# Patient Record
Sex: Male | Born: 1970 | Race: Black or African American | Hispanic: No | Smoking: Current every day smoker
Health system: Southern US, Community
[De-identification: ages and names within clinical notes are randomized; demographics above are authoritative.]

## PROBLEM LIST (undated history)

## (undated) DIAGNOSIS — D573 Sickle-cell trait: Secondary | ICD-10-CM

## (undated) DIAGNOSIS — R011 Cardiac murmur, unspecified: Secondary | ICD-10-CM

---

## 2000-12-12 ENCOUNTER — Encounter: Payer: Self-pay | Admitting: Emergency Medicine

## 2000-12-12 ENCOUNTER — Emergency Department (HOSPITAL_COMMUNITY): Admission: EM | Admit: 2000-12-12 | Discharge: 2000-12-12 | Payer: Self-pay | Admitting: Emergency Medicine

## 2001-02-20 ENCOUNTER — Encounter: Payer: Self-pay | Admitting: Emergency Medicine

## 2001-02-20 ENCOUNTER — Emergency Department (HOSPITAL_COMMUNITY): Admission: EM | Admit: 2001-02-20 | Discharge: 2001-02-20 | Payer: Self-pay | Admitting: Emergency Medicine

## 2001-02-21 ENCOUNTER — Encounter: Payer: Self-pay | Admitting: Emergency Medicine

## 2001-02-21 ENCOUNTER — Emergency Department (HOSPITAL_COMMUNITY): Admission: EM | Admit: 2001-02-21 | Discharge: 2001-02-21 | Payer: Self-pay | Admitting: Emergency Medicine

## 2003-09-07 ENCOUNTER — Encounter: Payer: Self-pay | Admitting: Emergency Medicine

## 2003-09-07 ENCOUNTER — Emergency Department (HOSPITAL_COMMUNITY): Admission: EM | Admit: 2003-09-07 | Discharge: 2003-09-07 | Payer: Self-pay | Admitting: Emergency Medicine

## 2006-10-08 ENCOUNTER — Emergency Department (HOSPITAL_COMMUNITY): Admission: EM | Admit: 2006-10-08 | Discharge: 2006-10-08 | Payer: Self-pay | Admitting: Emergency Medicine

## 2006-10-18 ENCOUNTER — Emergency Department (HOSPITAL_COMMUNITY): Admission: EM | Admit: 2006-10-18 | Discharge: 2006-10-18 | Payer: Self-pay | Admitting: Family Medicine

## 2008-03-07 ENCOUNTER — Emergency Department (HOSPITAL_COMMUNITY): Admission: EM | Admit: 2008-03-07 | Discharge: 2008-03-07 | Payer: Self-pay | Admitting: Emergency Medicine

## 2009-03-31 ENCOUNTER — Emergency Department (HOSPITAL_COMMUNITY): Admission: EM | Admit: 2009-03-31 | Discharge: 2009-03-31 | Payer: Self-pay | Admitting: Emergency Medicine

## 2009-12-15 ENCOUNTER — Emergency Department (HOSPITAL_COMMUNITY): Admission: EM | Admit: 2009-12-15 | Discharge: 2009-12-15 | Payer: Self-pay | Admitting: Emergency Medicine

## 2011-04-07 LAB — URINALYSIS, ROUTINE W REFLEX MICROSCOPIC
Bilirubin Urine: NEGATIVE
Ketones, ur: NEGATIVE mg/dL
Nitrite: NEGATIVE
pH: 6 (ref 5.0–8.0)

## 2011-04-07 LAB — URINE MICROSCOPIC-ADD ON

## 2013-06-12 ENCOUNTER — Encounter (HOSPITAL_COMMUNITY): Payer: Self-pay | Admitting: Nurse Practitioner

## 2013-06-12 ENCOUNTER — Emergency Department (HOSPITAL_COMMUNITY): Payer: Self-pay

## 2013-06-12 ENCOUNTER — Emergency Department (HOSPITAL_COMMUNITY)
Admission: EM | Admit: 2013-06-12 | Discharge: 2013-06-12 | Disposition: A | Discharge status: Home / Self Care | Payer: Self-pay | Attending: Emergency Medicine | Admitting: Emergency Medicine

## 2013-06-12 DIAGNOSIS — R011 Cardiac murmur, unspecified: Secondary | ICD-10-CM | POA: Insufficient documentation

## 2013-06-12 DIAGNOSIS — M25519 Pain in unspecified shoulder: Secondary | ICD-10-CM | POA: Insufficient documentation

## 2013-06-12 DIAGNOSIS — M25819 Other specified joint disorders, unspecified shoulder: Secondary | ICD-10-CM | POA: Insufficient documentation

## 2013-06-12 DIAGNOSIS — F17200 Nicotine dependence, unspecified, uncomplicated: Secondary | ICD-10-CM | POA: Insufficient documentation

## 2013-06-12 DIAGNOSIS — M7542 Impingement syndrome of left shoulder: Secondary | ICD-10-CM

## 2013-06-12 DIAGNOSIS — Z862 Personal history of diseases of the blood and blood-forming organs and certain disorders involving the immune mechanism: Secondary | ICD-10-CM | POA: Insufficient documentation

## 2013-06-12 DIAGNOSIS — R093 Abnormal sputum: Secondary | ICD-10-CM | POA: Insufficient documentation

## 2013-06-12 DIAGNOSIS — J069 Acute upper respiratory infection, unspecified: Secondary | ICD-10-CM | POA: Insufficient documentation

## 2013-06-12 HISTORY — DX: Sickle-cell trait: D57.3

## 2013-06-12 HISTORY — DX: Cardiac murmur, unspecified: R01.1

## 2013-06-12 MED ORDER — IBUPROFEN 400 MG PO TABS
400.0000 mg | ORAL_TABLET | Freq: Once | ORAL | Status: AC
  Administered 2013-06-12: 400 mg via ORAL
  Filled 2013-06-12: qty 1

## 2013-06-12 MED ORDER — OXYCODONE-ACETAMINOPHEN 5-325 MG PO TABS
1.0000 | ORAL_TABLET | Freq: Once | ORAL | Status: AC
  Administered 2013-06-12: 1 via ORAL
  Filled 2013-06-12: qty 1

## 2013-06-12 MED ORDER — OXYCODONE-ACETAMINOPHEN 5-325 MG PO TABS: ORAL_TABLET | ORAL | Status: DC

## 2013-06-12 NOTE — ED Notes (Signed)
Pt states he has had left mid-clavicular pain, sharp, going all the way through to his upper shoulder x 3 months. Pain worse with movement and position. Also c/o cough x 2 days. Started with "a tickle" in his throat. States his father has been coughing violently in the house without covering his mouth. Denies weight loss. Pt is in no distress at this time.

## 2013-06-12 NOTE — ED Notes (Signed)
Pt reports L shoulder pain for past 3 months, denies any injuries, cms intact. Also states "i think i might be getting pneumonia" reports cough with green sputum. Denies any cp, sob, or fevers. A&Ox4, resp e/u

## 2013-06-12 NOTE — ED Provider Notes (Signed)
History     CSN: [id number]  Arrival date & time 06/12/13  0930   First MD Initiated Contact with Patient 06/12/13 [phone]      Chief Complaint  Patient presents with  . Cough    (Consider location/radiation/quality/duration/timing/severity/associated sxs/prior treatment) HPI  Justin Rubio is a 42 y.o. male complaining of left shoulder pain worsening over the course of 3 months. Patient denies any trauma however he does state that he exercises frequently. Patient also reports a cough productive of green sputum worsening over the course of the last 5 days. He denies chest pain, fever, shortness of breath, abdominal pain, nausea vomiting, change in bowel or bladder habits.  Past Medical History  Diagnosis Date  . Sickle cell trait   . Heart murmur     History reviewed. No pertinent past surgical history.  History reviewed. No pertinent family history.  History  Substance Use Topics  . Smoking status: Current Every Day Smoker    Types: Cigarettes  . Smokeless tobacco: Not on file  . Alcohol Use: Yes      Review of Systems  Constitutional: Negative for fever.  Respiratory: Positive for cough. Negative for shortness of breath.   Cardiovascular: Negative for chest pain.  Gastrointestinal: Negative for nausea, vomiting, abdominal pain and diarrhea.  Musculoskeletal: Positive for arthralgias.  All other systems reviewed and are negative.    Allergies  Review of patient's allergies indicates no known allergies.  Home Medications   Current Outpatient Rx  Name  Route  Sig  Dispense  Refill  . oxyCODONE-acetaminophen (PERCOCET/ROXICET) 5-325 MG per tablet      1 to 2 tabs PO q6hrs  PRN for pain   15 tablet   0     BP 128/73  Pulse 88  Temp(Src) 98.7 F (37.1 C)  Resp 19  Ht 5\' 10"  (1.778 m)  Wt 160 lb (72.576 kg)  BMI 22.96 kg/m2  SpO2 98%  Physical Exam  Nursing note and vitals reviewed. Constitutional: He is oriented to person, place, and time. He  appears well-developed and well-nourished. No distress.  HENT:  Head: Normocephalic.  Mouth/Throat: Oropharynx is clear and moist.  Eyes: Conjunctivae and EOM are normal. Pupils are equal, round, and reactive to light.  Cardiovascular: Normal rate, regular rhythm and intact distal pulses.   Pulmonary/Chest: Effort normal and breath sounds normal. No stridor. No respiratory distress. He has no wheezes. He has no rales. He exhibits no tenderness.  Abdominal: Soft. Bowel sounds are normal. He exhibits no distension and no mass. There is no tenderness. There is no rebound and no guarding.  Musculoskeletal: Normal range of motion.  Palpable click in left shoulder abduction over 90. Diffusely tender to palpation of rotator cuff musculature. Neurovascularly intact.  Neurological: He is alert and oriented to person, place, and time.  Psychiatric: He has a normal mood and affect.    ED Course  Procedures (including critical care time)  Labs Reviewed - No data to display Dg Chest 2 View  06/12/2013   *RADIOLOGY REPORT*  Clinical Data: Cough and shortness of breath  CHEST - 2 VIEW  Comparison: None.  Findings: Lungs clear.  Heart size and pulmonary vascularity are normal.  No adenopathy.  No bone lesions.  IMPRESSION: No abnormality noted.   Original Report Authenticated By: Bretta Bang, M.D.   Dg Shoulder Left  06/12/2013   *RADIOLOGY REPORT*  Clinical Data: Shoulder pain  LEFT SHOULDER - 2+ VIEW  Comparison: None.  Findings:  Internal  rotation, external rotation, axillary, and Y scapular images were obtained.  There is no fracture or dislocation.  The left clavicle is positioned slightly superior to the acromion, raising concern for mild acromioclavicular separation of this area.  There is no appreciable joint space narrowing.  No erosive change or intra-articular calcifications.  IMPRESSION:   Question mild acromioclavicular separation.  Focal examination with respect to potential  acromioclavicular separation advised.  No frank dislocation.  No fracture.  No appreciable arthropathy.   Original Report Authenticated By: Bretta Bang, M.D.     1. Acute URI   2. Shoulder impingement syndrome, left       MDM   Filed Vitals:   06/12/13 0940  BP: 128/73  Pulse: 88  Temp: 98.7 F (37.1 C)  Resp: 19  Height: 5\' 10"  (1.778 m)  Weight: 160 lb (72.576 kg)  SpO2: 98%     Justin Rubio is a 42 y.o. male with left shoulder pain worsening over 3 months, there is a palpable click when patient abducts the shoulder. Patient reports productive cough, lung sounds are clear and plain films show no infiltrate.  Medications  oxyCODONE-acetaminophen (PERCOCET/ROXICET) 5-325 MG per tablet 1 tablet (not administered)  ibuprofen (ADVIL,MOTRIN) tablet 400 mg (400 mg Oral Given 06/12/13 1100)    Pt is hemodynamically stable, appropriate for, and amenable to discharge at this time. Pt verbalized understanding and agrees with care plan. Outpatient follow-up and specific return precautions discussed.    New Prescriptions   OXYCODONE-ACETAMINOPHEN (PERCOCET/ROXICET) 5-325 MG PER TABLET    1 to 2 tabs PO q6hrs  PRN for pain          Wynetta Emery, PA-C 06/12/13 1555

## 2013-06-13 NOTE — ED Provider Notes (Signed)
Medical screening examination/treatment/procedure(s) were performed by non-physician practitioner and as supervising physician I was immediately available for consultation/collaboration.   Yerlin Gasparyan M Emrys Mckamie, DO 06/13/13 0947 

## 2014-06-20 ENCOUNTER — Emergency Department (HOSPITAL_COMMUNITY): Payer: No Typology Code available for payment source

## 2014-06-20 ENCOUNTER — Emergency Department (HOSPITAL_COMMUNITY)
Admission: EM | Admit: 2014-06-20 | Discharge: 2014-06-20 | Disposition: A | Discharge status: Home / Self Care | Payer: No Typology Code available for payment source | Attending: Emergency Medicine | Admitting: Emergency Medicine

## 2014-06-20 ENCOUNTER — Encounter (HOSPITAL_COMMUNITY): Payer: Self-pay | Admitting: Emergency Medicine

## 2014-06-20 DIAGNOSIS — R52 Pain, unspecified: Secondary | ICD-10-CM | POA: Insufficient documentation

## 2014-06-20 DIAGNOSIS — Z23 Encounter for immunization: Secondary | ICD-10-CM | POA: Insufficient documentation

## 2014-06-20 DIAGNOSIS — T07XXXA Unspecified multiple injuries, initial encounter: Secondary | ICD-10-CM

## 2014-06-20 DIAGNOSIS — IMO0002 Reserved for concepts with insufficient information to code with codable children: Secondary | ICD-10-CM | POA: Insufficient documentation

## 2014-06-20 DIAGNOSIS — Z862 Personal history of diseases of the blood and blood-forming organs and certain disorders involving the immune mechanism: Secondary | ICD-10-CM | POA: Insufficient documentation

## 2014-06-20 DIAGNOSIS — Y929 Unspecified place or not applicable: Secondary | ICD-10-CM | POA: Insufficient documentation

## 2014-06-20 DIAGNOSIS — Z791 Long term (current) use of non-steroidal anti-inflammatories (NSAID): Secondary | ICD-10-CM | POA: Insufficient documentation

## 2014-06-20 DIAGNOSIS — Z79899 Other long term (current) drug therapy: Secondary | ICD-10-CM | POA: Insufficient documentation

## 2014-06-20 DIAGNOSIS — Y9389 Activity, other specified: Secondary | ICD-10-CM | POA: Insufficient documentation

## 2014-06-20 DIAGNOSIS — F17200 Nicotine dependence, unspecified, uncomplicated: Secondary | ICD-10-CM | POA: Insufficient documentation

## 2014-06-20 DIAGNOSIS — R011 Cardiac murmur, unspecified: Secondary | ICD-10-CM | POA: Insufficient documentation

## 2014-06-20 DIAGNOSIS — M25511 Pain in right shoulder: Secondary | ICD-10-CM

## 2014-06-20 MED ORDER — OXYCODONE-ACETAMINOPHEN 5-325 MG PO TABS: 2.0000 | ORAL_TABLET | ORAL | Status: DC | PRN

## 2014-06-20 MED ORDER — IBUPROFEN 800 MG PO TABS: 800.0000 mg | ORAL_TABLET | Freq: Three times a day (TID) | ORAL | Status: DC

## 2014-06-20 MED ORDER — METHOCARBAMOL 750 MG PO TABS: 750.0000 mg | ORAL_TABLET | Freq: Four times a day (QID) | ORAL | Status: DC

## 2014-06-20 MED ORDER — IBUPROFEN 800 MG PO TABS
800.0000 mg | ORAL_TABLET | Freq: Once | ORAL | Status: AC
  Administered 2014-06-20: 800 mg via ORAL
  Filled 2014-06-20: qty 1

## 2014-06-20 MED ORDER — TETANUS-DIPHTH-ACELL PERTUSSIS 5-2.5-18.5 LF-MCG/0.5 IM SUSP
0.5000 mL | Freq: Once | INTRAMUSCULAR | Status: AC
  Administered 2014-06-20: 0.5 mL via INTRAMUSCULAR
  Filled 2014-06-20: qty 0.5

## 2014-06-20 MED ORDER — OXYCODONE-ACETAMINOPHEN 5-325 MG PO TABS
2.0000 | ORAL_TABLET | Freq: Once | ORAL | Status: AC
  Administered 2014-06-20: 2 via ORAL
  Filled 2014-06-20: qty 2
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
## 2014-07-03 ENCOUNTER — Emergency Department (HOSPITAL_COMMUNITY)
Admission: EM | Admit: 2014-07-03 | Discharge: 2014-07-03 | Disposition: A | Discharge status: Home / Self Care | Payer: No Typology Code available for payment source | Attending: Emergency Medicine | Admitting: Emergency Medicine

## 2014-07-03 ENCOUNTER — Encounter (HOSPITAL_COMMUNITY): Payer: Self-pay | Admitting: Emergency Medicine

## 2014-07-03 DIAGNOSIS — R209 Unspecified disturbances of skin sensation: Secondary | ICD-10-CM | POA: Insufficient documentation

## 2014-07-03 DIAGNOSIS — Z79899 Other long term (current) drug therapy: Secondary | ICD-10-CM | POA: Insufficient documentation

## 2014-07-03 DIAGNOSIS — Z87828 Personal history of other (healed) physical injury and trauma: Secondary | ICD-10-CM | POA: Insufficient documentation

## 2014-07-03 DIAGNOSIS — Z862 Personal history of diseases of the blood and blood-forming organs and certain disorders involving the immune mechanism: Secondary | ICD-10-CM | POA: Insufficient documentation

## 2014-07-03 DIAGNOSIS — H539 Unspecified visual disturbance: Secondary | ICD-10-CM | POA: Insufficient documentation

## 2014-07-03 DIAGNOSIS — F17200 Nicotine dependence, unspecified, uncomplicated: Secondary | ICD-10-CM | POA: Insufficient documentation

## 2014-07-03 DIAGNOSIS — R202 Paresthesia of skin: Secondary | ICD-10-CM

## 2014-07-03 DIAGNOSIS — R011 Cardiac murmur, unspecified: Secondary | ICD-10-CM | POA: Insufficient documentation

## 2014-07-03 MED ORDER — ACETAMINOPHEN 325 MG PO TABS
650.0000 mg | ORAL_TABLET | Freq: Once | ORAL | Status: AC
  Administered 2014-07-03: 650 mg via ORAL
  Filled 2014-07-03: qty 2
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
## 2014-07-03 NOTE — ED Provider Notes (Signed)
Medical screening examination/treatment/procedure(s) were performed by non-physician practitioner and as supervising physician I was immediately available for consultation/collaboration.   EKG Interpretation None        Kirstine Jacquin M Emogene Muratalla, MD 07/03/14 1549 
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
## 2014-07-07 ENCOUNTER — Encounter (HOSPITAL_COMMUNITY): Payer: Self-pay | Admitting: Emergency Medicine

## 2014-07-07 ENCOUNTER — Emergency Department (HOSPITAL_COMMUNITY)
Admission: EM | Admit: 2014-07-07 | Discharge: 2014-07-07 | Disposition: A | Discharge status: Home / Self Care | Payer: No Typology Code available for payment source | Attending: Emergency Medicine | Admitting: Emergency Medicine

## 2014-07-07 DIAGNOSIS — Z79899 Other long term (current) drug therapy: Secondary | ICD-10-CM | POA: Insufficient documentation

## 2014-07-07 DIAGNOSIS — Z043 Encounter for examination and observation following other accident: Secondary | ICD-10-CM | POA: Insufficient documentation

## 2014-07-07 DIAGNOSIS — Z862 Personal history of diseases of the blood and blood-forming organs and certain disorders involving the immune mechanism: Secondary | ICD-10-CM | POA: Insufficient documentation

## 2014-07-07 DIAGNOSIS — R29898 Other symptoms and signs involving the musculoskeletal system: Secondary | ICD-10-CM | POA: Insufficient documentation

## 2014-07-07 DIAGNOSIS — R011 Cardiac murmur, unspecified: Secondary | ICD-10-CM | POA: Insufficient documentation

## 2014-07-07 DIAGNOSIS — Z041 Encounter for examination and observation following transport accident: Secondary | ICD-10-CM

## 2014-07-07 DIAGNOSIS — F17200 Nicotine dependence, unspecified, uncomplicated: Secondary | ICD-10-CM | POA: Insufficient documentation

## 2014-07-07 MED ORDER — IBUPROFEN 800 MG PO TABS
800.0000 mg | ORAL_TABLET | Freq: Once | ORAL | Status: AC
  Administered 2014-07-07: 800 mg via ORAL
  Filled 2014-07-07: qty 1

## 2014-07-07 MED ORDER — CYCLOBENZAPRINE HCL 10 MG PO TABS
5.0000 mg | ORAL_TABLET | Freq: Once | ORAL | Status: AC
  Administered 2014-07-07: 5 mg via ORAL
  Filled 2014-07-07: qty 1

## 2014-07-07 NOTE — ED Notes (Signed)
Here previous for same symptoms
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
## 2014-07-07 NOTE — Discharge Instructions (Signed)
°Emergency Department Resource Guide °1) Find a Doctor and Pay Out of Pocket °Although you won't have to find out who is covered by your insurance plan, it is a good idea to ask around and get recommendations. You will then need to call the office and see if the doctor you have chosen will accept you as a new patient and what types of options they offer for patients who are self-pay. Some doctors offer discounts or will set up payment plans for their patients who do not have insurance, but you will need to ask so you aren't surprised when you get to your appointment. ° °2) Contact Your Local Health Department °Not all health departments have doctors that can see patients for sick visits, but many do, so it is worth a call to see if yours does. If you don't know where your local health department is, you can check in your phone book. The CDC also has a tool to help you locate your state's health department, and many state websites also have listings of all of their local health departments. ° °3) Find a Walk-in Clinic °If your illness is not likely to be very severe or complicated, you may want to try a walk in clinic. These are popping up all over the country in pharmacies, drugstores, and shopping centers. They're usually staffed by nurse practitioners or physician assistants that have been trained to treat common illnesses and complaints. They're usually fairly quick and inexpensive. However, if you have serious medical issues or chronic medical problems, these are probably not your best option. ° °No Primary Care Doctor: °- Call [facility] at  832-8000 - they can help you locate a primary care doctor that  accepts your insurance, provides certain services, etc. °- Physician Referral Service- 1-800-533-3463 ° °Chronic Pain Problems: °Organization         Address  Phone   Notes  °[facility]  (336) 297-2271 Patients need to be referred by their primary care doctor.  ° °Medication  Assistance: °Organization         Address  Phone   Notes  °[place] [facility] [address] °[place], [place] [address] (336) 641-8030 --Must be a resident of [place] °-- Must have NO insurance coverage whatsoever (no Medicaid/ Medicare, etc.) °-- The pt. MUST have a primary care doctor that directs their care regularly and follows them in the community °  °[facility]  (866) 331-1348   °[employer]  (888) 892-1162   ° °Agencies that provide inexpensive medical care: °Organization         Address  Phone   Notes  °[facility]  (336) 832-8035   ° Internal Medicine    (336) 832-7272   °[facility] [address] °[place], [place] [address] (336) 832-4777   °[facility] [address], °[place] (336) 271-4999   °[facility]    (336) 373-0678   °[facility]    (336) 272-1050   °[facility] ° [address], [place] Phone:  (336) 832-4444, Fax:  (336) 832-4440 Hours of Operation:  9 am - 6 pm, M-F.  Also accepts Medicaid/Medicare and self-pay.  °[facility] ° [address], [place] Phone: (336) 832-3150, Fax: (336) 832-3151. Hours of Operation:  8:30 am - 5:30 pm, M-F.  Also accepts Medicaid and self-pay.  °[facility] [place] [address], [place] Phone: (336) 878-6027   °[facility] [address], [place], [place] (336)723-1848, Ext. 123 Mondays & Thursdays: 7-9 AM.  First 15 patients are seen on a first come, first serve basis. °  ° °Medicaid-accepting [place] Providers: ° °Organization         Address  Phone   Notes  °[facility] [address], [place] (336) 641-2100 Also accepts self-pay patients.  °[facility] [address], [place] ° (336) 856-9996   °[facility] [address], [place]  (336) 288-8857   °[facility] [address] [place] [address], [place] (336) 299-7000   °Veita Bland [address], [place]  ° (336) 373-1557 Only accepts [place] Access Medicaid patients after they have their name applied to their card.  ° °Self-Pay (no insurance) in [place]: ° °Organization         Address  Phone   Notes  °Sickle Cell Patients, [facility] [address], [place] (336) 832-1970   °[facility] [address], [place] (336) 832-4400   °[facility] ° [address],  (336) 992-4800   °[facility]/Dr. Osei-Bonsu ° [address] [place] [address], [place] or [address], [place] (336) 841-8500 Phone number for both [place] and [place] locations is the same.  °[facility] [address], [place] (336) 299-0000   °[facility] [address] [place] [address], [place] or [address] (336) 852-7530 °(336) 878-2260   °[facility] [address], [place] (336) 350-1642, phone; (336) 294-5005, fax Sees patients 1st and 3rd Saturday of every month.  Must not qualify for public or private insurance (i.e. Medicaid, Medicare, [place] Health Choice, Veterans' Benefits) • Household income should be no more than 200% of the poverty level •The clinic cannot treat you if you are pregnant or think you are pregnant • Sexually transmitted diseases are not treated at the clinic.  ° ° °Dental Care: °Organization         Address  Phone  Notes  °[place] [facility] [address], [place] (336) 641-6152 Accepts children up to age 21 who are enrolled in Medicaid or [place] Health Choice; pregnant women with a Medicaid card; and children who have applied for Medicaid or [place] Health Choice, but were declined, whose parents can pay a reduced fee at time of service.  °[place]  [facility] [place]  [address], [place] (336) 641-7733 Accepts children up to age 21 who are enrolled in Medicaid or [place] Health Choice; pregnant women with a Medicaid card; and children who have applied for Medicaid or  Health Choice, but were declined, whose parents can pay a reduced fee at time of service.  °[facility] ° [address],  (336) 641-4533 Patients are seen by appointment only. Walk-ins are not accepted. [facility] will see patients 18 years of age and older. °Monday - Tuesday (8am-5pm) °Most Wednesdays (8:30-5pm) °$30 per visit, cash only  °[facility] ° [address], [place] (336) 641-4533 Patients are seen by appointment only. Walk-ins are not accepted. [facility] will see patients 18 years of age and older. °One   Wednesday Evening (Monthly: Volunteer Based).  $30 per visit, cash only  °[facility]  (919) 537-3737 for adults; Children under age 4, call Graduate Pediatric Dentistry at (919) [phone]. Children aged 4-14, please call (919) [phone] to request a pediatric application. ° Dental services are provided in all areas of dental care including fillings, crowns and bridges, complete and partial dentures, implants, gum treatment, root canals, and extractions. Preventive care is also provided. Treatment is provided to both adults and children. °Patients are selected via a lottery and there is often a waiting list. °  °[facility] [address], °[place] ° (336) 763-8833 [email or web address] °  °[facility] [address], [place], [place] (336)723-1848, Ext. 123 Second and Fourth Thursday of each month, opens at 6:30 AM; Clinic ends at 9 AM.  Patients are seen on a first-come first-served basis, and a limited number are seen during each clinic.  ° °[facility] ° [address], [place], [place] (336) 723-7904    Eligibility Requirements °You must have lived in [place], or [place] counties for at least the last three months. °  You cannot be eligible for state or federal sponsored healthcare insurance, including Veterans Administration, Medicaid, or Medicare. °  You generally cannot be eligible for healthcare insurance through your employer.  °  How to apply: °Eligibility screenings are held every Tuesday and Wednesday afternoon from 1:00 pm until 4:00 pm. You do not need an appointment for the interview!  °[facility] [address], [place], [place] 336-631-2330   °[facility]  336-342-8273   °[facility]  336-703-3100   °[facility]  336-570-6415   ° °Behavioral Health Resources in the Community: °Intensive Outpatient Programs °Organization         Address  Phone  Notes  °[facility] [address], [place], [place] 336-878-6098   °[facility] [address], [place], [place] 336-832-9800   °[facility] [address], [place], [place] ° 336-882-2125   °[facility] [address],  °[place], [place] 1-800-853-5163 or 336-641-4981   °Substance Abuse Resources °Organization         Address  Phone  Notes  °[facility]  336-882-2125   °[facility]  336-784-9470   °[facility]  336-285-9073   °[facility]  336-845-3988   °Residential & Outpatient Substance Abuse Program  1-800-659-3381   °Psychological Services °Organization         Address  Phone  Notes  °[facility]  336- 832-9600   °[facility]  336- 378-7881   °[facility] [address], [place] 1-800-853-5163 or 336-641-4981   ° °Mobile Crisis Teams °Organization         Address  Phone  Notes  °[facility], Mobile Crisis Care Unit  1-877-626-1772   °Assertive °[facility] ° [address]  [place], [place] 336-834-9664   °Sharon DeEsch [address] °[place] 336-554-5454   ° °Self-Help/Support Groups °Organization         Address  Phone             Notes  °[facility]  - variety of support groups  336- 373-1402 Call for more information  °Narcotics Anonymous (NA), [facility] [address], °[place]  2 meetings at this location  ° °  Residential Treatment Programs °Organization         Address  Phone  Notes  °[facility] [address],    °[place]  1-866-801-8205   °[facility] ° [address], [place], [place] 704-293-8524   °[facility] [address], [place] 336-845-3988 Admissions: 8am-3pm M-F  °[facility] [address]    °[place], [place] 336-841-1104   °[facility] [address], [place], [place] 336-379-7146   °[facility] [address]  °[place], [place] 336-285-9073   °[facility] - Intensive Outpatient [address], [place], [place] 336-852-3033   °[facility] [address]  °[place], [place] 1-877-615-2722 or 336-784-9470   °[facility] ([facility] [address] [place], [place] 336-227-7417 Accepts Medicaid  °[facility] [address]  °[place] 1-800-659-3381 Substance Abuse/Addiction Treatment  ° °[place] Behavioral Health Resources °Organization         Address  Phone  Notes  °[facility]  (888) 581-9988   °Julie Brannon, PhD [address] [place], [place]   (336) 349-5553 or (336) 951-0000   °[facility]   [address] °[place], [place] (336) 349-4454   °[facility] [address], [place], [place] (336) 342-8316 Insurance/Medicaid/sponsorship through [facility]  °[facility] [address] [place] [address]                                    [place], [place] (336) 342-8316 Therapy/tele-psych/case    °[facility] [address]  ° [place], [place] (336) 349-2233    °Dr. Arfeen  (336) 349-4544   °[facility] [place]  [employer] [place] [facility] 1) [address], [place] °2) [address], [place] °3)  [address], [place] (336) 349-3220 °(336) 342-7768 ° °(336) 342-8140   °[place] [facility] (336) 342-1394 or (336) 342-3537 (After Hours)    ° ° °
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
## 2018-05-21 ENCOUNTER — Emergency Department (HOSPITAL_COMMUNITY)
Admission: EM | Admit: 2018-05-21 | Discharge: 2018-05-21 | Disposition: A | Discharge status: Home / Self Care | Payer: Self-pay | Attending: Emergency Medicine | Admitting: Emergency Medicine

## 2018-05-21 ENCOUNTER — Emergency Department (HOSPITAL_COMMUNITY): Payer: Self-pay

## 2018-05-21 ENCOUNTER — Other Ambulatory Visit: Payer: Self-pay

## 2018-05-21 ENCOUNTER — Encounter (HOSPITAL_COMMUNITY): Payer: Self-pay | Admitting: Emergency Medicine

## 2018-05-21 DIAGNOSIS — Y999 Unspecified external cause status: Secondary | ICD-10-CM | POA: Insufficient documentation

## 2018-05-21 DIAGNOSIS — S61240A Puncture wound with foreign body of right index finger without damage to nail, initial encounter: Secondary | ICD-10-CM | POA: Insufficient documentation

## 2018-05-21 DIAGNOSIS — S60450A Superficial foreign body of right index finger, initial encounter: Secondary | ICD-10-CM

## 2018-05-21 DIAGNOSIS — W228XXA Striking against or struck by other objects, initial encounter: Secondary | ICD-10-CM | POA: Insufficient documentation

## 2018-05-21 DIAGNOSIS — Y929 Unspecified place or not applicable: Secondary | ICD-10-CM | POA: Insufficient documentation

## 2018-05-21 DIAGNOSIS — Y93H9 Activity, other involving exterior property and land maintenance, building and construction: Secondary | ICD-10-CM | POA: Insufficient documentation

## 2018-05-21 DIAGNOSIS — F17210 Nicotine dependence, cigarettes, uncomplicated: Secondary | ICD-10-CM | POA: Insufficient documentation

## 2018-05-21 MED ORDER — LIDOCAINE HCL (PF) 1 % IJ SOLN
10.0000 mL | Freq: Once | INTRAMUSCULAR | Status: AC
  Administered 2018-05-21: 10 mL
  Filled 2018-05-21: qty 10

## 2018-05-21 MED ORDER — DOXYCYCLINE HYCLATE 100 MG PO CAPS: 100.0000 mg | ORAL_CAPSULE | Freq: Two times a day (BID) | ORAL | 0 refills | Status: DC

## 2018-05-21 NOTE — Discharge Instructions (Addendum)
Take antibiotics as prescribed.  Take the entire course, even if your symptoms improve. Soak your hand in antibacterial soapy water for 20 minutes at a time 2-3 times a day for the next 10 days. When you are not soaking your hand, keep a dry dressing on your finger, change it daily. Follow-up with the hand doctor if you have any worsening symptoms, or symptoms are not resolved after finishing antibiotics. Return to the emergency room if you develop high fevers, inability to move your finger, streaking red lines going up your hand, or any new or concerning symptoms.

## 2018-05-21 NOTE — ED Provider Notes (Signed)
[facility] EMERGENCY DEPARTMENT Provider Note   CSN: [id number] Arrival date & time: 05/21/18  1945     History   Chief Complaint Chief Complaint  Patient presents with  . Finger Injury    HPI Justin Rubio is a 47 y.o. male presenting for evaluation of right index finger injury.  Patient presenting for evaluation of right index finger injury.  Last Saturday, he was removing a wedge from a tree and the hammer hit the ulnar aspect of his right index finger at the PIP.  He had acute onset of pain and bleeding from the radial aspect of his finger.  Since then, he has had persistent pain and continued swelling.  He reports pus is draining out of the site of injury.  He states it feels like something is in his finger.  He denies numbness or tingling.  He has a history of sickle cell trait and anemia, but does not take any medications daily.  He is not on blood thinners.  He is not immunocompromised.  He denies streaking or redness going into his hand.  Pain with movement of his fingers.  He is unable to completely flex his finger due to pain and swelling.  He reports feeling abnormal sensations of the middle and fourth finger today.  He has been taking ibuprofen without improvement of pain.  Nothing has made it better.  He denies fevers, but states that his hand and forearm has felt off since the injury.   HPI  Past Medical History:  Diagnosis Date  . Heart murmur   . Sickle cell trait (HCC)     There are no active problems to display for this patient.   History reviewed. No pertinent surgical history.      Home Medications    Prior to Admission medications   Medication Sig Start Date End Date Taking? Authorizing Provider  doxycycline (VIBRAMYCIN) 100 MG capsule Take 1 capsule (100 mg total) by mouth 2 (two) times daily. 05/21/18   Lesle Faron, PA-C  methocarbamol (ROBAXIN-750) 750 MG tablet Take 1 tablet (750 mg total) by mouth 4 (four) times  daily. Patient not taking: Reported on 05/21/2018 06/20/14   Marisa Severin, MD  oxyCODONE-acetaminophen (PERCOCET/ROXICET) 5-325 MG per tablet Take 2 tablets by mouth every 4 (four) hours as needed for severe pain. Patient not taking: Reported on 05/21/2018 06/20/14   Marisa Severin, MD    Family History No family history on file.  Social History Social History   Tobacco Use  . Smoking status: Current Every Day Smoker    Types: Cigarettes  . Smokeless tobacco: Never Used  Substance Use Topics  . Alcohol use: Yes  . Drug use: Yes    Types: Cocaine, Marijuana     Allergies   Penicillins   Review of Systems Review of Systems  Constitutional: Negative for fever.  Musculoskeletal: Positive for arthralgias and joint swelling.  Neurological: Negative for numbness.  Hematological: Does not bruise/bleed easily.     Physical Exam Updated Vital Signs BP 110/68   Pulse 78   Temp 98.2 F (36.8 C) (Oral)   Resp 18   SpO2 100%   Physical Exam  Constitutional: He is oriented to person, place, and time. He appears well-developed and well-nourished. No distress.  HENT:  Head: Normocephalic and atraumatic.  Eyes: EOM are normal.  Neck: Normal range of motion.  Pulmonary/Chest: Effort normal.  Abdominal: He exhibits no distension.  Musculoskeletal: He exhibits edema, tenderness and deformity.  Obvious swelling of the PIP of the right index finger with open cut without active drainage or bleeding.  Decreased range of motion due to pain and swelling.  Sensation intact.  Good cap refill.  No obvious streaking.  Full active range of motion of thumb and remaining fingers.  Full active ROM  of the wrist. Soft compartments. Radial pulses intact bilaterally.   Neurological: He is alert and oriented to person, place, and time. No sensory deficit.  Skin: Skin is warm. No rash noted.  Psychiatric: He has a normal mood and affect.  Nursing note and vitals reviewed.        ED Treatments /  Results  Labs (all labs ordered are listed, but only abnormal results are displayed) Labs Reviewed - No data to display  EKG None  Radiology Dg Hand Complete Right  Result Date: 05/21/2018 CLINICAL DATA:  Right index finger pain and swelling. EXAM: RIGHT HAND - COMPLETE 3+ VIEW COMPARISON:  None. FINDINGS: There is no evidence of fracture or dislocation. There is no evidence of arthropathy or other focal bone abnormality. Metallic foreign body in the soft tissues along the dorsal aspect of the second proximal phalanx. No other soft tissue abnormality. IMPRESSION: 1.  No acute osseous injury of the right hand. 2. Metallic foreign body in the soft tissues along the dorsal aspect of the second proximal phalanx. Electronically Signed   By: Elige Ko   On: 05/21/2018 20:37    Procedures .Foreign Body Removal Date/Time: 05/21/2018 11:46 PM Performed by: Alveria Apley, PA-C Authorized by: Alveria Apley, PA-C  Consent: Verbal consent obtained. Risks and benefits: risks, benefits and alternatives were discussed Consent given by: patient Intake: R index finger. Anesthesia: digital block  Anesthesia: Local Anesthetic: lidocaine 1% without epinephrine Anesthetic total: 10 mL Patient cooperative: yes Complexity: simple 1 objects recovered. Objects recovered: metal shard Post-procedure assessment: foreign body removed Patient tolerance: Patient tolerated the procedure well with no immediate complications   (including critical care time)  Medications Ordered in ED Medications  lidocaine (PF) (XYLOCAINE) 1 % injection 10 mL (10 mLs Infiltration Given 05/21/18 2218)     Initial Impression / Assessment and Plan / ED Course  I have reviewed the triage vital signs and the nursing notes.  Pertinent labs & imaging results that were available during my care of the patient were reviewed by me and considered in my medical decision making (see chart for details).     Patient  presenting for evaluation of finger pain.  Physical exam shows swollen DIP joint.  X-ray reviewed and interpreted by me, shows foreign object in the dorsal aspect of the right index finger.  No bony involvement.  Patient without obvious streaking, neurovascularly intact.  Discussed with Dr. Aundria Rud from orthopedics, who recommends I&D/foreign object removal and keeping the incision open.  Recommends twice daily soaks and antibacterial soap and oral antibiotics.  Follow-up with him if symptoms are not improving or worsen.  Foreign object successfully removed.  Patient reports improvement of pain/pressure.  Will discharge with Dr. Aundria Rud' instructions.  Patient wanted to penicillin, placed on doxycycline.  At this time, patient appears a for discharge.  Return percussions given.  Patient states he understands and agrees plan.  Final Clinical Impressions(s) / ED Diagnoses   Final diagnoses:  Foreign body of right index finger    ED Discharge Orders        Ordered    doxycycline (VIBRAMYCIN) 100 MG capsule  2 times daily  05/21/18 2316       Alveria Apley, PA-C 05/21/18 2349    Rolland Porter, MD 05/27/18 [phone]

## 2018-05-21 NOTE — ED Triage Notes (Signed)
C/o infection to R index finger.  States he hit it with a hammer while trying to get a wedge out of a tree last Saturday.  C/o pain, swelling, and difficulty moving finger.

## 2021-01-08 ENCOUNTER — Encounter (HOSPITAL_COMMUNITY): Payer: Self-pay | Admitting: Emergency Medicine

## 2021-01-08 ENCOUNTER — Emergency Department (HOSPITAL_COMMUNITY): Payer: Self-pay

## 2021-01-08 ENCOUNTER — Emergency Department (HOSPITAL_COMMUNITY)
Admission: EM | Admit: 2021-01-08 | Discharge: 2021-01-08 | Disposition: A | Discharge status: Home / Self Care | Payer: Self-pay | Attending: Emergency Medicine | Admitting: Emergency Medicine

## 2021-01-08 DIAGNOSIS — Z5321 Procedure and treatment not carried out due to patient leaving prior to being seen by health care provider: Secondary | ICD-10-CM | POA: Insufficient documentation

## 2021-01-08 DIAGNOSIS — Y990 Civilian activity done for income or pay: Secondary | ICD-10-CM | POA: Insufficient documentation

## 2021-01-08 DIAGNOSIS — Y9289 Other specified places as the place of occurrence of the external cause: Secondary | ICD-10-CM | POA: Insufficient documentation

## 2021-01-08 DIAGNOSIS — W231XXA Caught, crushed, jammed, or pinched between stationary objects, initial encounter: Secondary | ICD-10-CM | POA: Insufficient documentation

## 2021-01-08 DIAGNOSIS — M79662 Pain in left lower leg: Secondary | ICD-10-CM | POA: Insufficient documentation
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
## 2021-01-09 ENCOUNTER — Encounter (HOSPITAL_COMMUNITY): Payer: Self-pay

## 2021-01-09 ENCOUNTER — Ambulatory Visit (HOSPITAL_COMMUNITY)
Admission: EM | Admit: 2021-01-09 | Discharge: 2021-01-09 | Disposition: A | Discharge status: Home / Self Care | Payer: Self-pay

## 2021-01-09 DIAGNOSIS — S8012XA Contusion of left lower leg, initial encounter: Secondary | ICD-10-CM
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
## 2021-01-09 NOTE — Discharge Instructions (Addendum)
Take ibuprofen as needed.  Rest and elevate your leg.  Apply ice packs 2-3 times a day for up to 20 minutes each.   Follow up with an orthopedist if your symptoms are not improving.

## 2021-07-29 ENCOUNTER — Inpatient Hospital Stay (HOSPITAL_COMMUNITY): Admission: RE | Admit: 2021-07-29 | Payer: Self-pay | Source: Ambulatory Visit

## 2021-07-29 ENCOUNTER — Encounter (HOSPITAL_COMMUNITY): Payer: Self-pay

## 2021-07-29 ENCOUNTER — Emergency Department (HOSPITAL_COMMUNITY): Payer: Self-pay

## 2021-07-29 ENCOUNTER — Other Ambulatory Visit: Payer: Self-pay

## 2021-07-29 ENCOUNTER — Emergency Department (HOSPITAL_COMMUNITY)
Admission: EM | Admit: 2021-07-29 | Discharge: 2021-07-29 | Disposition: A | Discharge status: Home / Self Care | Payer: Self-pay | Attending: Emergency Medicine | Admitting: Emergency Medicine

## 2021-07-29 DIAGNOSIS — F17210 Nicotine dependence, cigarettes, uncomplicated: Secondary | ICD-10-CM | POA: Insufficient documentation

## 2021-07-29 DIAGNOSIS — U071 COVID-19: Secondary | ICD-10-CM | POA: Insufficient documentation

## 2021-07-29 DIAGNOSIS — R300 Dysuria: Secondary | ICD-10-CM | POA: Insufficient documentation

## 2021-07-29 LAB — CBC WITH DIFFERENTIAL/PLATELET
Abs Immature Granulocytes: 0.01 10*3/uL (ref 0.00–0.07)
Basophils Absolute: 0.1 10*3/uL (ref 0.0–0.1)
Basophils Relative: 2 %
Eosinophils Absolute: 0.1 10*3/uL (ref 0.0–0.5)
Eosinophils Relative: 2 %
HCT: 39.6 % (ref 39.0–52.0)
Hemoglobin: 14 g/dL (ref 13.0–17.0)
Immature Granulocytes: 0 %
Lymphocytes Relative: 25 %
Lymphs Abs: 1 10*3/uL (ref 0.7–4.0)
MCH: 30.6 pg (ref 26.0–34.0)
MCHC: 35.4 g/dL (ref 30.0–36.0)
MCV: 86.7 fL (ref 80.0–100.0)
Monocytes Absolute: 0.7 10*3/uL (ref 0.1–1.0)
Monocytes Relative: 19 %
Neutro Abs: 2.1 10*3/uL (ref 1.7–7.7)
Neutrophils Relative %: 52 %
Platelets: 269 10*3/uL (ref 150–400)
RBC: 4.57 MIL/uL (ref 4.22–5.81)
RDW: 14.2 % (ref 11.5–15.5)
WBC: 3.9 10*3/uL — ABNORMAL LOW (ref 4.0–10.5)
nRBC: 0 % (ref 0.0–0.2)

## 2021-07-29 LAB — URINALYSIS, ROUTINE W REFLEX MICROSCOPIC
Bilirubin Urine: NEGATIVE
Glucose, UA: NEGATIVE mg/dL
Hgb urine dipstick: NEGATIVE
Ketones, ur: 5 mg/dL — AB
Nitrite: NEGATIVE
Protein, ur: NEGATIVE mg/dL
Specific Gravity, Urine: 1.012 (ref 1.005–1.030)
WBC, UA: >50 WBC/hpf — ABNORMAL HIGH (ref 0–5)
pH: 5 (ref 5.0–8.0)

## 2021-07-29 LAB — COMPREHENSIVE METABOLIC PANEL
ALT: 25 U/L (ref 0–44)
AST: 36 U/L (ref 15–41)
Albumin: 4.1 g/dL (ref 3.5–5.0)
Alkaline Phosphatase: 37 U/L — ABNORMAL LOW (ref 38–126)
Anion gap: 11 (ref 5–15)
BUN: 10 mg/dL (ref 6–20)
CO2: 26 mmol/L (ref 22–32)
Calcium: 8.5 mg/dL — ABNORMAL LOW (ref 8.9–10.3)
Chloride: 101 mmol/L (ref 98–111)
Creatinine, Ser: 1.01 mg/dL (ref 0.61–1.24)
GFR, Estimated: >60 mL/min (ref >60)
Glucose, Bld: 77 mg/dL (ref 70–99)
Potassium: 3.5 mmol/L (ref 3.5–5.1)
Sodium: 138 mmol/L (ref 135–145)
Total Bilirubin: 0.9 mg/dL (ref 0.3–1.2)
Total Protein: 6.8 g/dL (ref 6.5–8.1)

## 2021-07-29 LAB — RESP PANEL BY RT-PCR (FLU A&B, COVID) ARPGX2
Influenza A by PCR: NEGATIVE
Influenza B by PCR: NEGATIVE
SARS Coronavirus 2 by RT PCR: POSITIVE — AB

## 2021-07-29 MED ORDER — ACETAMINOPHEN 325 MG PO TABS
650.0000 mg | ORAL_TABLET | Freq: Once | ORAL | Status: AC
  Administered 2021-07-29: 650 mg via ORAL
  Filled 2021-07-29: qty 2

## 2021-07-29 MED ORDER — AZITHROMYCIN 250 MG PO TABS
2000.0000 mg | ORAL_TABLET | Freq: Every day | ORAL | Status: DC
  Administered 2021-07-29: 2000 mg via ORAL
  Filled 2021-07-29: qty 8

## 2021-07-29 MED ORDER — GENTAMICIN SULFATE 40 MG/ML IJ SOLN
240.0000 mg | Freq: Once | INTRAMUSCULAR | Status: AC
  Administered 2021-07-29: 240 mg via INTRAMUSCULAR
  Filled 2021-07-29: qty 6

## 2021-07-29 MED ORDER — DOXYCYCLINE HYCLATE 100 MG PO CAPS: 100.0000 mg | ORAL_CAPSULE | Freq: Two times a day (BID) | ORAL | 0 refills | Status: AC

## 2021-07-29 MED ORDER — ONDANSETRON 4 MG PO TBDP: 4.0000 mg | ORAL_TABLET | Freq: Three times a day (TID) | ORAL | 0 refills | Status: DC | PRN

## 2021-07-29 NOTE — ED Provider Notes (Signed)
Emergency Medicine Provider Triage Evaluation Note  Justin Rubio , a 50 y.o. male  was evaluated in triage.  Pt complains of generalized weakness, decreased sense of smell, cough, fever, body aches, low back pain, poor p.o. intake.  Patient states he has not been able to eat solid foods for 4 weeks due to the fact that it is hot.  He is tolerating liquids.  Over the past couple days, he started to feel worse.  He denies sick contacts.  He is vaccinated for COVID.  Cough is productive of green/yellow sputum. He reports dizziness, worse with changing head position  Review of Systems  Positive: Cough, arthralgias, weakness, dizziness Negative: HA  Physical Exam  BP 115/84 (BP Location: Right Arm)   Pulse 97   Temp 99.1 F (37.3 C) (Oral)   Resp 18   SpO2 99%  Gen:   Awake, no distress   Resp:  Normal effort  MSK:   Moves extremities without difficulty  Other:  No ttp of the abd  Medical Decision Making  Medically screening exam initiated at 3:54 PM.  Appropriate orders placed.  Justin Rubio was informed that the remainder of the evaluation will be completed by another provider, this initial triage assessment does not replace that evaluation, and the importance of remaining in the ED until their evaluation is complete.  Labs, cxr, ekg, ua, covid   Villa Quintero, PA-C 07/29/21 1556    Gloris Manchester, MD 07/31/21 [phone]

## 2021-07-29 NOTE — ED Notes (Signed)
Called pt 3x, no response.  

## 2021-07-29 NOTE — ED Provider Notes (Signed)
[facility] EM[facility]ENC[facility] DEPARTMENT Provider Note   CSN: [id number] Arrival date & time: 07/29/21  1456     History No chief complaint on file.   Justin Rubio is a 50 y.o. male.  HPI Patient is a 50 year old male with a medical history as noted below.  He presents to the emergency department due to generalized weakness, decreased sense of smell, cough, intermittent fevers, body aches, low back pain, decreased p.o. intake.  States that his symptoms started about 1 week ago.  Also notes that he works in very hot weather outdoors and has had difficulty with p.o. intake due to his work conditions over the past month.  States that his symptoms began worsening over the past 2 days.  Denies any known sick contacts.  He is vaccinated for COVID x2.  Reports productive cough with green/yellow sputum.  Also reports lightheadedness that is worse with changes in position.  Lastly, patient reports intermittent dysuria.  States that he had intercourse with a new male partner last weekend they do not use protection.    Past Medical History:  Diagnosis Date   Heart murmur    Sickle cell trait ([place])     There are no problems to display for this patient.   History reviewed. No pertinent surgical history.     No family history on file.  Social History   Tobacco Use   Smoking status: Every Day    Types: Cigarettes   Smokeless tobacco: Never  Substance Use Topics   Alcohol use: Yes   Drug use: Yes    Types: Cocaine, Marijuana    Home Medications Prior to Admission medications   Medication Sig Start Date End Date Taking? Authorizing Provider  doxycycline (VIBRAMYCIN) 100 MG capsule Take 1 capsule (100 mg total) by mouth 2 (two) times daily for 7 days. 07/29/21 08/05/21 Yes Rayna Sexton, PA-C  ondansetron (ZOFRAN ODT) 4 MG disintegrating tablet Take 1 tablet (4 mg total) by mouth every 8 (eight) hours as needed for nausea or vomiting. 07/29/21  Yes Rayna Sexton,  PA-C  methocarbamol (ROBAXIN-750) 750 MG tablet Take 1 tablet (750 mg total) by mouth 4 (four) times daily. Patient not taking: Reported on 05/21/2018 06/20/14   Linton Flemings, MD  oxyCODONE-acetaminophen (PERCOCET/ROXICET) 5-325 MG per tablet Take 2 tablets by mouth every 4 (four) hours as needed for severe pain. Patient not taking: Reported on 05/21/2018 06/20/14   Linton Flemings, MD    Allergies    Penicillins  Review of Systems   Review of Systems  All other systems reviewed and are negative. Ten systems reviewed and are negative for acute change, except as noted in the HPI.   Physical Exam Updated Vital Signs BP 111/83   Pulse 76   Temp 99.1 F (37.3 C) (Oral)   Resp (!) 34   SpO2 96%   Physical Exam Vitals and nursing note reviewed.  Constitutional:      General: He is not in acute distress.    Appearance: Normal appearance. He is not ill-appearing, toxic-appearing or diaphoretic.  HENT:     Head: Normocephalic and atraumatic.     Right Ear: External ear normal.     Left Ear: External ear normal.     Nose: Nose normal.     Mouth/Throat:     Mouth: Mucous membranes are moist.     Pharynx: Oropharynx is clear. No oropharyngeal exudate or posterior oropharyngeal erythema.  Eyes:     Extraocular Movements: Extraocular movements intact.  Cardiovascular:     Rate and Rhythm: Normal rate and regular rhythm.     Pulses: Normal pulses.     Heart sounds: Normal heart sounds. No murmur heard.   No friction rub. No gallop.  Pulmonary:     Effort: Pulmonary effort is normal. No respiratory distress.     Breath sounds: Normal breath sounds. No stridor. No wheezing, rhonchi or rales.     Comments: Lungs are clear to auscultation bilaterally.  Oxygen saturations fluctuate between 99% to 100% on room air while ambulating. Abdominal:     General: Abdomen is flat.     Palpations: Abdomen is soft.     Tenderness: There is no abdominal tenderness.  Musculoskeletal:        General: Normal  range of motion.     Cervical back: Normal range of motion and neck supple. No tenderness.  Skin:    General: Skin is warm and dry.  Neurological:     General: No focal deficit present.     Mental Status: He is alert and oriented to person, place, and time.  Psychiatric:        Mood and Affect: Mood normal.        Behavior: Behavior normal.    ED Results / Procedures / Treatments   Labs (all labs ordered are listed, but only abnormal results are displayed) Labs Reviewed  RESP PANEL BY RT-PCR (FLU A&B, COVID) ARPGX2 - Abnormal; Notable for the following components:      Result Value   SARS Coronavirus 2 by RT PCR POSITIVE (*)    All other components within normal limits  CBC WITH DIFFERENTIAL/PLATELET - Abnormal; Notable for the following components:   WBC 3.9 (*)    All other components within normal limits  COMPREHENSIVE METABOLIC PANEL - Abnormal; Notable for the following components:   Calcium 8.5 (*)    Alkaline Phosphatase 37 (*)    All other components within normal limits  URINALYSIS, ROUTINE W REFLEX MICROSCOPIC - Abnormal; Notable for the following components:   APPearance HAZY (*)    Ketones, ur 5 (*)    Leukocytes,Ua MODERATE (*)    WBC, UA >50 (*)    Bacteria, UA RARE (*)    All other components within normal limits  GC/CHLAMYDIA PROBE AMP ([place]) NOT AT [facility]   EKG None  Radiology DG Chest 2 View  Result Date: 07/29/2021 CLINICAL DATA:  Shortness of breath EXAM: CHEST - 2 VIEW COMPARISON:  06/12/2013 FINDINGS: The heart size and mediastinal contours are within normal limits. Both lungs are clear. The visualized skeletal structures are unremarkable. IMPRESSION: No active cardiopulmonary disease. Electronically Signed   By: Donavan Foil M.D.   On: 07/29/2021 16:29    Procedures Procedures   Medications Ordered in ED Medications  azithromycin (ZITHROMAX) tablet 2,000 mg (2,000 mg Oral Given 07/29/21 2135)  acetaminophen (TYLENOL) tablet 650 mg (650 mg  Oral Given 07/29/21 1554)  gentamicin (GARAMYCIN) injection 240 mg (240 mg Intramuscular Given 07/29/21 2138)    ED Course  I have reviewed the triage vital signs and the nursing notes.  Pertinent labs & imaging results that were available during my care of the patient were reviewed by me and considered in my medical decision making (see chart for details).    MDM Rules/Calculators/A&P                          Pt is a 50 y.o. male who  presents to the emergency department with symptoms related to COVID-19.  Also complains of dysuria.  Labs: CBC with a white blood cell count of 3.9. CMP with a calcium of 8.5 and an alk phos of 37. Respiratory panel is positive for COVID-19. UA shows 5 ketones, moderate leukocytes, greater than 50 white blood cells, rare bacteria, white blood cell clumps.  Imaging: Chest x-ray shows no active cardiopulmonary disease.  I, Rayna Sexton, PA-C, personally reviewed and evaluated these images and lab results as part of my medical decision-making.  Physical exam reassuring.  Lungs are clear to auscultation bilaterally.  Heart is regular rate and rhythm without murmurs, rubs, or gallops.  Chest x-ray is negative.  Patient ambulated in the room and had no episodes of hypoxia on my exam.  Patient also complains of dysuria for the past week.  Reports a new sexual partner last week and did not use protection.  UA concerning for likely infection.  Will treat for GC/chlamydia prophylactically but will also obtain a GC/chlamydia test.  Patient has a penicillin allergy so we will treat with gentamicin as well as azithromycin and discharged on a course of doxycycline.  Patient understands to refrain from sex until he completes his antibiotics.  Discussed return precautions in length.  Patient understands needs to quarantine based on current CDC guidelines.  Feel the patient is stable for discharge at this time and he is agreeable.  His questions were answered and he was  amicable at the time of discharge.  Note: Portions of this report may have been transcribed using voice recognition software. Every effort was made to ensure accuracy; however, inadvertent computerized transcription errors may be present.   Final Clinical Impression(s) / ED Diagnoses Final diagnoses:  COVID-19  Dysuria   Rx / DC Orders ED Discharge Orders          Ordered    ondansetron (ZOFRAN ODT) 4 MG disintegrating tablet  Every 8 hours PRN        07/29/21 2145    doxycycline (VIBRAMYCIN) 100 MG capsule  2 times daily        07/29/21 2145             Rayna Sexton, PA-C 07/29/21 2153    Margette Fast, MD 07/30/21 1110

## 2021-07-29 NOTE — Discharge Instructions (Addendum)
I am prescribing you a medication called Zofran.  This is a disintegrating tablet you can use up to 3 times a day for management of your nausea and vomiting.  Please only take this as prescribed.  Please only take this if you are experiencing nausea and vomiting that you cannot control.  I am also prescribing you an antibiotic called doxycycline.  I want you to take this for your urinary symptoms.  Please make sure you take this twice a day for the next 7 days.  Do not stop taking this early.  Please make sure you refrain from sex until you complete this antibiotic.  Please follow-up with the health department for further retesting.  If you develop any new or worsening symptoms please come back to the emergency department.  Please make sure that you quarantine based on current CDC guidelines.  I have attached a significant amount of information on COVID-19 for you to reference.

## 2021-07-29 NOTE — ED Triage Notes (Signed)
Patient complains of lower back pain x 1 day with lose of smell for same. Patient complains of fever and body aches

## 2021-07-30 LAB — GC/CHLAMYDIA PROBE AMP (~~LOC~~) NOT AT ARMC
Chlamydia: NEGATIVE
Comment: NEGATIVE
Comment: NORMAL
Neisseria Gonorrhea: NEGATIVE
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
## 2023-11-07 ENCOUNTER — Ambulatory Visit: Payer: Medicaid Other | Admitting: Physician Assistant

## 2023-11-07 ENCOUNTER — Encounter: Payer: Self-pay | Admitting: Physician Assistant

## 2023-11-07 VITALS — BP 122/88 | HR 94 | Ht 69.0 in | Wt 142.0 lb

## 2023-11-07 DIAGNOSIS — J302 Other seasonal allergic rhinitis: Secondary | ICD-10-CM | POA: Diagnosis not present

## 2023-11-07 DIAGNOSIS — Z0000 Encounter for general adult medical examination without abnormal findings: Secondary | ICD-10-CM

## 2023-11-07 DIAGNOSIS — M25511 Pain in right shoulder: Secondary | ICD-10-CM

## 2023-11-07 DIAGNOSIS — Z13220 Encounter for screening for lipoid disorders: Secondary | ICD-10-CM

## 2023-11-07 DIAGNOSIS — Z125 Encounter for screening for malignant neoplasm of prostate: Secondary | ICD-10-CM

## 2023-11-07 DIAGNOSIS — G8929 Other chronic pain: Secondary | ICD-10-CM

## 2023-11-07 DIAGNOSIS — M25512 Pain in left shoulder: Secondary | ICD-10-CM

## 2023-11-07 DIAGNOSIS — Z5902 Unsheltered homelessness: Secondary | ICD-10-CM

## 2023-11-07 DIAGNOSIS — M5441 Lumbago with sciatica, right side: Secondary | ICD-10-CM

## 2023-11-07 MED ORDER — CETIRIZINE HCL 10 MG PO TABS: 10.0000 mg | ORAL_TABLET | Freq: Every day | ORAL | 11 refills | Status: AC

## 2023-11-07 MED ORDER — MELOXICAM 7.5 MG PO TABS: 7.5000 mg | ORAL_TABLET | Freq: Every day | ORAL | 0 refills | Status: AC

## 2023-11-07 NOTE — Patient Instructions (Addendum)
To help with your shoulder and back pain, you are going to start taking meloxicam once daily.  I have started a referral for you to be seen by orthopedics for further evaluation.  To help with the itching in your ears, you are going to start taking Zyrtec once daily.  We will call you with today's lab results.  I encourage you to return to the mobile unit in approximately 2 weeks for follow-up unless you have establish with a primary care provider by then.  Roney Jaffe, PA-C Physician Assistant [facility] [email or web address]  Chronic Back Pain Chronic back pain is back pain that lasts longer than 3 months. The cause of your back pain may not be known. Some common causes include: Wear and tear (degenerative disease) of the bones, disks, or tissues that connect bones to each other (ligaments) in your back. Inflammation and stiffness in your back (arthritis). If you have chronic back pain, you may have times when the pain is more intense (flare-ups). You can also learn to manage the pain with home care. Follow these instructions at home: Watch for any changes in your symptoms. Take these actions to help with your pain: Managing pain and stiffness     If told, put ice on the painful area. You may be told to apply ice for the first 24-48 hours after a flare-up starts. Put ice in a plastic bag. Place a towel between your skin and the bag. Leave the ice on for 20 minutes, 2-3 times per day. If told, apply heat to the affected area as often as told by your health care provider. Use the heat source that your provider recommends, such as a moist heat pack or a heating pad. Place a towel between your skin and the heat source. Leave the heat on for 20-30 minutes. If your skin turns bright red, remove the ice or heat right away to prevent skin damage. The risk of damage is higher if you cannot feel pain, heat, or cold. Try soaking in a  warm tub. Activity        Avoid bending and other activities that make the pain worse. Have good posture when you stand or sit. When you stand, keep your upper back and neck straight, with your shoulders pulled back. Avoid slouching. When you sit, keep your back straight. Relax your shoulders. Do not round your shoulders or pull them backward. Do not sit or stand in one place for too long. Take brief periods of rest during the day. This will reduce your pain. Resting in a lying or standing position is often better than sitting to rest. When you rest for longer periods, mix in some mild activity or stretching between periods of rest. This will help to prevent stiffness and pain. Get regular exercise. Ask your provider what activities are safe for you. You may have to avoid lifting. Ask your provider how much you can safely lift. If you do lift, always use the right technique. This means you should: Bend your knees. Keep the load close to your body. Avoid twisting. Medicines Take over-the-counter and prescription medicines only as told by your provider. You may need to take medicines for pain and inflammation. These may be taken by mouth or put on the skin. You may also be given muscle relaxants. Ask your provider if the medicine prescribed to you: Requires you to avoid driving or using machinery. Can cause constipation. You may need to take these actions to prevent or  treat constipation: Drink enough fluid to keep your pee (urine) pale yellow. Take over-the-counter or prescription medicines. Eat foods that are high in fiber, such as beans, whole grains, and fresh fruits and vegetables. Limit foods that are high in fat and processed sugars, such as fried or sweet foods. General instructions  Sleep on a firm mattress in a comfortable position. Try lying on your side with your knees slightly bent. If you lie on your back, put a pillow under your knees. Do not use any products that contain  nicotine or tobacco. These products include cigarettes, chewing tobacco, and vaping devices, such as e-cigarettes. If you need help quitting, ask your provider. Contact a health care provider if: You have pain that does not get better with rest or medicine. You have new pain. You have a fever. You lose weight quickly. You have trouble doing your normal activities. You feel weak or numb in one or both of your legs or feet. Get help right away if: You are not able to control when you pee or poop. You have severe back pain and: Nausea or vomiting. Pain in your chest or abdomen. Shortness of breath. You faint. These symptoms may be an emergency. Get help right away. Call 911. Do not wait to see if the symptoms will go away. Do not drive yourself to the hospital. This information is not intended to replace advice given to you by your health care provider. Make sure you discuss any questions you have with your health care provider. Document Revised: 08/02/2022 Document Reviewed: 08/02/2022 Elsevier Patient Education  2024 [employer].
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
## 2023-11-08 DIAGNOSIS — G8929 Other chronic pain: Secondary | ICD-10-CM | POA: Insufficient documentation

## 2023-11-08 DIAGNOSIS — M5441 Lumbago with sciatica, right side: Secondary | ICD-10-CM | POA: Insufficient documentation

## 2023-11-08 LAB — LIPID PANEL
Chol/HDL Ratio: 1.9 ratio (ref 0.0–5.0)
Cholesterol, Total: 215 mg/dL — ABNORMAL HIGH (ref 100–199)
HDL: 111 mg/dL (ref >39)
LDL Chol Calc (NIH): 91 mg/dL (ref 0–99)
Triglycerides: 77 mg/dL (ref 0–149)
VLDL Cholesterol Cal: 13 mg/dL (ref 5–40)

## 2023-11-08 LAB — PSA: Prostate Specific Ag, Serum: 0.4 ng/mL (ref 0.0–4.0)

## 2023-11-08 LAB — COMP. METABOLIC PANEL (12)
AST: 25 [IU]/L (ref 0–40)
Albumin: 4.6 g/dL (ref 4.1–5.1)
Alkaline Phosphatase: 51 [IU]/L (ref 44–121)
BUN/Creatinine Ratio: 15 (ref 9–20)
BUN: 13 mg/dL (ref 6–24)
Bilirubin Total: 0.6 mg/dL (ref 0.0–1.2)
Calcium: 10.1 mg/dL (ref 8.7–10.2)
Chloride: 99 mmol/L (ref 96–106)
Creatinine, Ser: 0.88 mg/dL (ref 0.76–1.27)
Globulin, Total: 2.1 g/dL (ref 1.5–4.5)
Glucose: 83 mg/dL (ref 70–99)
Potassium: 4.7 mmol/L (ref 3.5–5.2)
Sodium: 139 mmol/L (ref 134–144)
Total Protein: 6.7 g/dL (ref 6.0–8.5)
eGFR: 105 mL/min/{1.73_m2} (ref >59)

## 2023-11-08 LAB — CBC WITH DIFFERENTIAL/PLATELET
Basophils Absolute: 0.1 10*3/uL (ref 0.0–0.2)
Basos: 1 %
EOS (ABSOLUTE): 0.1 10*3/uL (ref 0.0–0.4)
Eos: 2 %
Hematocrit: 43.4 % (ref 37.5–51.0)
Hemoglobin: 14.3 g/dL (ref 13.0–17.7)
Immature Grans (Abs): 0 10*3/uL (ref 0.0–0.1)
Immature Granulocytes: 0 %
Lymphocytes Absolute: 1.3 10*3/uL (ref 0.7–3.1)
Lymphs: 25 %
MCH: 29.9 pg (ref 26.6–33.0)
MCHC: 32.9 g/dL (ref 31.5–35.7)
MCV: 91 fL (ref 79–97)
Monocytes Absolute: 0.5 10*3/uL (ref 0.1–0.9)
Monocytes: 9 %
Neutrophils Absolute: 3.3 10*3/uL (ref 1.4–7.0)
Neutrophils: 63 %
Platelets: 340 10*3/uL (ref 150–450)
RBC: 4.78 x10E6/uL (ref 4.14–5.80)
RDW: 13.6 % (ref 11.6–15.4)
WBC: 5.3 10*3/uL (ref 3.4–10.8)

## 2023-11-13 ENCOUNTER — Other Ambulatory Visit: Payer: Self-pay

## 2023-11-13 ENCOUNTER — Emergency Department (HOSPITAL_COMMUNITY)
Admission: EM | Admit: 2023-11-13 | Discharge: 2023-11-13 | Disposition: A | Discharge status: Home / Self Care | Payer: Medicaid Other | Attending: Emergency Medicine | Admitting: Emergency Medicine

## 2023-11-13 ENCOUNTER — Encounter (HOSPITAL_COMMUNITY): Payer: Self-pay

## 2023-11-13 DIAGNOSIS — K029 Dental caries, unspecified: Secondary | ICD-10-CM | POA: Diagnosis not present

## 2023-11-13 DIAGNOSIS — K0889 Other specified disorders of teeth and supporting structures: Secondary | ICD-10-CM | POA: Diagnosis present

## 2023-11-13 MED ORDER — CLINDAMYCIN HCL 300 MG PO CAPS: 300.0000 mg | ORAL_CAPSULE | Freq: Three times a day (TID) | ORAL | 0 refills | Status: AC

## 2023-11-13 MED ORDER — IBUPROFEN 800 MG PO TABS: 800.0000 mg | ORAL_TABLET | Freq: Three times a day (TID) | ORAL | 0 refills | Status: AC | PRN

## 2023-11-13 NOTE — ED Provider Notes (Signed)
[place] EMERGENCY DEPARTMENT AT [facility] Provider Note   CSN: [id number] Arrival date & time: 11/13/23  6010     History  Chief Complaint  Patient presents with   Dental Problem    Justin Rubio is a 52 y.o. male.  Pt complains of dental pain on left side.  Pt reports symptoms began 2 days ago.  No fever or chills.    The history is provided by the patient. No language interpreter was used.  No dentist.  Pt denies fever or chills      Home Medications Prior to Admission medications   Medication Sig Start Date End Date Taking? Authorizing Provider  clindamycin (CLEOCIN) 300 MG capsule Take 1 capsule (300 mg total) by mouth 3 (three) times daily for 10 days. 11/13/23 11/23/23 Yes Elson Areas, PA-C  ibuprofen (ADVIL) 800 MG tablet Take 1 tablet (800 mg total) by mouth every 8 (eight) hours as needed. 11/13/23  Yes Cheron Schaumann K, PA-C  cetirizine (ZYRTEC ALLERGY) 10 MG tablet Take 1 tablet (10 mg total) by mouth daily. 11/07/23   Mayers, Cari S, PA-C  meloxicam (MOBIC) 7.5 MG tablet Take 1 tablet (7.5 mg total) by mouth daily. 11/07/23   Mayers, Cari S, PA-C      Allergies    Bee venom and Penicillins    Review of Systems   Review of Systems  HENT:  Positive for dental problem.   All other systems reviewed and are negative.   Physical Exam Updated Vital Signs BP 130/82 (BP Location: Right Arm)   Pulse 96   Temp 98.3 F (36.8 C)   Resp 18   Ht 5\' 9"  (1.753 m)   Wt 64.4 kg   SpO2 100%   BMI 20.97 kg/m  Physical Exam Vitals and nursing note reviewed.  Constitutional:      Appearance: He is well-developed.  HENT:     Head: Normocephalic.     Mouth/Throat:     Mouth: Mucous membranes are moist.     Comments: Dental decay Cardiovascular:     Rate and Rhythm: Normal rate.  Pulmonary:     Effort: Pulmonary effort is normal.  Abdominal:     General: There is no distension.  Musculoskeletal:        General: Normal range of motion.      Cervical back: Normal range of motion.  Lymphadenopathy:     Cervical: No cervical adenopathy.  Skin:    General: Skin is warm.  Neurological:     General: No focal deficit present.     Mental Status: He is alert and oriented to person, place, and time.  Psychiatric:        Mood and Affect: Mood normal.     ED Results / Procedures / Treatments   Labs (all labs ordered are listed, but only abnormal results are displayed) Labs Reviewed - No data to display  EKG None  Radiology No results found.  Procedures Procedures    Medications Ordered in ED Medications - No data to display  ED Course/ Medical Decision Making/ A&P                                 Medical Decision Making Pt complains of a toothache. No dentis  Risk Prescription drug management.           Final Clinical Impression(s) / ED Diagnoses Final diagnoses:  Pain, dental  Rx / DC Orders ED Discharge Orders          Ordered    clindamycin (CLEOCIN) 300 MG capsule  3 times daily        11/13/23 0904    ibuprofen (ADVIL) 800 MG tablet  Every 8 hours PRN        11/13/23 0904           An After Visit Summary was printed and given to the patient.    Elson Areas, [place] 11/13/23 1610    Gerhard Munch, MD 11/13/23 [phone]

## 2023-11-13 NOTE — Discharge Instructions (Addendum)
Follow up with dentist for evaluation

## 2023-11-13 NOTE — ED Triage Notes (Signed)
Patient reports dental pain in left upper x 2 days but now has moderate swelling to left side of face.  Reports last time they gave him resources to dental clinics but couldn't get anyone to see him and he needs the tooth pulled.  Reports he was prescribed antbiotics when it happened awhile a go but wasn't about to get them.  Denies fever.

## 2023-11-17 ENCOUNTER — Ambulatory Visit: Payer: Medicaid Other | Admitting: Orthopaedic Surgery

## 2023-11-21 ENCOUNTER — Telehealth: Payer: Self-pay | Admitting: General Practice

## 2023-11-21 NOTE — Telephone Encounter (Signed)
Reached out to pt to inform him of [facility] locations. No answer/LVM.

## 2023-11-30 ENCOUNTER — Other Ambulatory Visit (INDEPENDENT_AMBULATORY_CARE_PROVIDER_SITE_OTHER): Payer: Medicaid Other

## 2023-11-30 ENCOUNTER — Encounter: Payer: Self-pay | Admitting: Orthopaedic Surgery

## 2023-11-30 ENCOUNTER — Ambulatory Visit: Payer: Medicaid Other | Admitting: Orthopaedic Surgery

## 2023-11-30 DIAGNOSIS — G8929 Other chronic pain: Secondary | ICD-10-CM | POA: Diagnosis not present

## 2023-11-30 DIAGNOSIS — M25511 Pain in right shoulder: Secondary | ICD-10-CM

## 2023-11-30 DIAGNOSIS — M542 Cervicalgia: Secondary | ICD-10-CM | POA: Diagnosis not present

## 2023-11-30 MED ORDER — PREDNISONE 10 MG (21) PO TBPK: ORAL_TABLET | ORAL | 0 refills | Status: AC

## 2023-11-30 MED ORDER — METHOCARBAMOL 750 MG PO TABS: 750.0000 mg | ORAL_TABLET | Freq: Two times a day (BID) | ORAL | 2 refills | Status: AC | PRN
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
## 2023-12-19 ENCOUNTER — Ambulatory Visit: Payer: Medicaid Other | Attending: Physician Assistant | Admitting: Physical Therapy

## 2024-03-14 ENCOUNTER — Other Ambulatory Visit: Payer: Self-pay | Admitting: Family Medicine

## 2024-03-14 ENCOUNTER — Ambulatory Visit
Admission: RE | Admit: 2024-03-14 | Discharge: 2024-03-14 | Disposition: A | Discharge status: Home / Self Care | Source: Ambulatory Visit | Attending: Family Medicine | Admitting: Family Medicine

## 2024-03-14 DIAGNOSIS — M549 Dorsalgia, unspecified: Secondary | ICD-10-CM

## 2024-06-26 ENCOUNTER — Ambulatory Visit (HOSPITAL_COMMUNITY): Admission: EM | Admit: 2024-06-26 | Discharge: 2024-06-26 | Disposition: A | Discharge status: Home / Self Care

## 2024-06-26 ENCOUNTER — Encounter (HOSPITAL_COMMUNITY): Payer: Self-pay | Admitting: Emergency Medicine

## 2024-06-26 DIAGNOSIS — K047 Periapical abscess without sinus: Secondary | ICD-10-CM | POA: Diagnosis not present

## 2024-06-26 MED ORDER — AMOXICILLIN-POT CLAVULANATE 875-125 MG PO TABS: 1.0000 | ORAL_TABLET | Freq: Two times a day (BID) | ORAL | 0 refills | Status: AC

## 2024-06-26 MED ORDER — DICLOFENAC SODIUM 50 MG PO TBEC: 50.0000 mg | DELAYED_RELEASE_TABLET | Freq: Two times a day (BID) | ORAL | 1 refills | Status: AC

## 2024-06-26 NOTE — ED Triage Notes (Signed)
 Pt having right side dental pain and swelling ofr 3 days. Hasn't taken anything for pain.

## 2024-06-26 NOTE — ED Provider Notes (Signed)
 [facility] [place]  Note:  This document was prepared using Dragon voice recognition software and may include unintentional dictation errors.  MRN: [id number] DOB: 07/04/1971  Subjective:   Justin Rubio is a 53 y.o. male presenting for right-sided dental pain and swelling x 3 days.  Patient denies taking any over-the-counter medication to treat symptoms.  Patient has past history of dental infection and broken teeth.  Patient has not seen a dentist in several years.  Patient reports that he is taking Augmentin previously for dental infections without any problems despite having documented allergy to penicillin.  Patient denies any fever or purulent drainage from mouth.  Patient states that he has had dental extractions in the past and believes he may have other teeth that need to be removed.  No current facility-administered medications for this encounter.  Current Outpatient Medications:    amoxicillin-clavulanate (AUGMENTIN) 875-125 MG tablet, Take 1 tablet by mouth every 12 (twelve) hours., Disp: 14 tablet, Rfl: 0   diclofenac (VOLTAREN) 50 MG EC tablet, Take 1 tablet (50 mg total) by mouth 2 (two) times daily., Disp: 30 tablet, Rfl: 1   mirtazapine (REMERON) 7.5 MG tablet, Take 7.5 mg by mouth daily., Disp: , Rfl:    cetirizine  (ZYRTEC  ALLERGY) 10 MG tablet, Take 1 tablet (10 mg total) by mouth daily., Disp: 30 tablet, Rfl: 11   ibuprofen  (ADVIL ) 800 MG tablet, Take 1 tablet (800 mg total) by mouth every 8 (eight) hours as needed., Disp: 30 tablet, Rfl: 0   meloxicam  (MOBIC ) 7.5 MG tablet, Take 1 tablet (7.5 mg total) by mouth daily., Disp: 30 tablet, Rfl: 0   methocarbamol  (ROBAXIN -750) 750 MG tablet, Take 1 tablet (750 mg total) by mouth 2 (two) times daily as needed for muscle spasms., Disp: 20 tablet, Rfl: 2   predniSONE  (STERAPRED UNI-PAK 21 TAB) 10 MG (21) TBPK tablet, Take as directed, Disp: 21 tablet, Rfl: 0   Allergies  Allergen Reactions   Bee Venom Swelling    Penicillins Other (See Comments)    Has patient had a PCN reaction causing immediate rash, facial/tongue/throat swelling, SOB or lightheadedness with hypotension: Unknown Has patient had a PCN reaction causing severe rash involving mucus membranes or skin necrosis: Unknown Has patient had a PCN reaction that required hospitalization: Unknown Has patient had a PCN reaction occurring within the last 10 years: No If all of the above answers are NO, then may proceed with Cephalosporin use.   Childhood allergy     Past Medical History:  Diagnosis Date   Heart murmur    Sickle cell trait (HCC)      History reviewed. No pertinent surgical history.  Family History  Problem Relation Age of Onset   Pancreatic cancer Mother    Heart disease Father     Social History   Tobacco Use   Smoking status: Every Day    Types: Cigarettes   Smokeless tobacco: Never  Vaping Use   Vaping status: Every Day   Substances: Nicotine, THC  Substance Use Topics   Alcohol use: Yes   Drug use: Yes    Types: Cocaine, Marijuana    ROS Refer to HPI for ROS details.  Objective:   Vitals: BP 132/88 (BP Location: Left Arm)   Pulse 71   Temp 99.1 F (37.3 C) (Oral)   Resp 14   SpO2 98%   Physical Exam Vitals and nursing note reviewed.  Constitutional:      General: He is not in acute distress.  Appearance: Normal appearance. He is well-developed. He is not ill-appearing or toxic-appearing.  HENT:     Head: Normocephalic.     Mouth/Throat:     Lips: Pink.     Mouth: Mucous membranes are moist.     Dentition: Abnormal dentition. Dental tenderness, gingival swelling, dental caries and gum lesions present. No dental abscesses.     Pharynx: Oropharynx is clear. No posterior oropharyngeal erythema.   Cardiovascular:     Rate and Rhythm: Normal rate.  Pulmonary:     Effort: Pulmonary effort is normal. No respiratory distress.   Skin:    General: Skin is warm and dry.   Neurological:      General: No focal deficit present.     Mental Status: He is alert and oriented to person, place, and time.   Psychiatric:        Mood and Affect: Mood normal.        Behavior: Behavior normal.     Procedures  No results found for this or any previous visit (from the past 24 hours).  No results found.   Assessment and Plan :     Discharge Instructions       1. Chronic dental infection (Primary) - amoxicillin-clavulanate (AUGMENTIN) 875-125 MG tablet; Take 1 tablet by mouth every 12 (twelve) hours for acute on chronic dental infection - diclofenac (VOLTAREN) 50 MG EC tablet; Take 1 tablet (50 mg total) by mouth 2 (two) times daily for acute pain secondary to dental infection - Dental providers resource list provided for patient for follow-up evaluation and management of ongoing dental infection and possible need for dental extraction. - Clean mouth out with mouthwash 2-3 times a day to help decrease bacterial infiltration and improve infection.      Climmie Cronce B Daelyn Pettaway   Yuette Putnam, [place], [place] 06/26/24 1325

## 2024-06-26 NOTE — Discharge Instructions (Addendum)
  1. Chronic dental infection (Primary) - amoxicillin-clavulanate (AUGMENTIN) 875-125 MG tablet; Take 1 tablet by mouth every 12 (twelve) hours for acute on chronic dental infection - diclofenac (VOLTAREN) 50 MG EC tablet; Take 1 tablet (50 mg total) by mouth 2 (two) times daily for acute pain secondary to dental infection - Dental providers resource list provided for patient for follow-up evaluation and management of ongoing dental infection and possible need for dental extraction. - Clean mouth out with mouthwash 2-3 times a day to help decrease bacterial infiltration and improve infection.
